# Patient Record
Sex: Male | Born: 1965 | Race: White | Hispanic: No | Marital: Single | Smoking: Current every day smoker
Health system: Southern US, Community
[De-identification: ages and names within clinical notes are randomized; demographics above are authoritative.]

## PROBLEM LIST (undated history)

## (undated) DIAGNOSIS — G4730 Sleep apnea, unspecified: Secondary | ICD-10-CM

## (undated) DIAGNOSIS — E119 Type 2 diabetes mellitus without complications: Secondary | ICD-10-CM

## (undated) HISTORY — PX: EYE SURGERY: SHX253

---

## 1997-12-25 ENCOUNTER — Emergency Department (HOSPITAL_COMMUNITY): Admission: EM | Admit: 1997-12-25 | Discharge: 1997-12-25 | Payer: Self-pay | Admitting: Emergency Medicine

## 2003-05-03 ENCOUNTER — Ambulatory Visit (HOSPITAL_COMMUNITY): Admission: RE | Admit: 2003-05-03 | Discharge: 2003-05-03 | Payer: Self-pay | Admitting: Family Medicine

## 2003-05-03 ENCOUNTER — Encounter: Payer: Self-pay | Admitting: Family Medicine

## 2004-08-13 ENCOUNTER — Inpatient Hospital Stay (HOSPITAL_COMMUNITY): Admission: RE | Admit: 2004-08-13 | Discharge: 2004-08-18 | Payer: Self-pay | Admitting: Psychiatry

## 2004-08-13 ENCOUNTER — Ambulatory Visit: Payer: Self-pay | Admitting: Psychiatry

## 2004-08-13 ENCOUNTER — Emergency Department (HOSPITAL_COMMUNITY): Admission: EM | Admit: 2004-08-13 | Discharge: 2004-08-13 | Payer: Self-pay | Admitting: Emergency Medicine

## 2004-08-31 ENCOUNTER — Other Ambulatory Visit (HOSPITAL_COMMUNITY): Admission: RE | Admit: 2004-08-31 | Discharge: 2004-09-15 | Payer: Self-pay | Admitting: Psychiatry

## 2004-09-29 ENCOUNTER — Inpatient Hospital Stay (HOSPITAL_COMMUNITY): Admission: RE | Admit: 2004-09-29 | Discharge: 2004-10-04 | Payer: Self-pay | Admitting: Psychiatry

## 2004-09-29 ENCOUNTER — Emergency Department (HOSPITAL_COMMUNITY): Admission: EM | Admit: 2004-09-29 | Discharge: 2004-09-29 | Payer: Self-pay | Admitting: Emergency Medicine

## 2004-09-29 ENCOUNTER — Ambulatory Visit: Payer: Self-pay | Admitting: Psychiatry

## 2004-10-25 ENCOUNTER — Ambulatory Visit (HOSPITAL_BASED_OUTPATIENT_CLINIC_OR_DEPARTMENT_OTHER): Admission: RE | Admit: 2004-10-25 | Discharge: 2004-10-25 | Payer: Self-pay

## 2004-10-25 ENCOUNTER — Ambulatory Visit: Payer: Self-pay | Admitting: Addiction Medicine

## 2004-10-25 ENCOUNTER — Other Ambulatory Visit (HOSPITAL_COMMUNITY): Admission: RE | Admit: 2004-10-25 | Discharge: 2004-11-09 | Payer: Self-pay | Admitting: Psychiatry

## 2005-07-11 ENCOUNTER — Ambulatory Visit (HOSPITAL_COMMUNITY): Admission: RE | Admit: 2005-07-11 | Discharge: 2005-07-12 | Payer: Self-pay | Admitting: Orthopedic Surgery

## 2007-04-01 ENCOUNTER — Emergency Department (HOSPITAL_COMMUNITY): Admission: EM | Admit: 2007-04-01 | Discharge: 2007-04-01 | Payer: Self-pay | Admitting: Emergency Medicine

## 2007-05-02 ENCOUNTER — Ambulatory Visit (HOSPITAL_COMMUNITY): Admission: RE | Admit: 2007-05-02 | Discharge: 2007-05-03 | Payer: Self-pay | Admitting: *Deleted

## 2010-05-07 ENCOUNTER — Telehealth (INDEPENDENT_AMBULATORY_CARE_PROVIDER_SITE_OTHER): Payer: Self-pay | Admitting: Internal Medicine

## 2010-05-07 ENCOUNTER — Encounter (INDEPENDENT_AMBULATORY_CARE_PROVIDER_SITE_OTHER): Payer: Self-pay | Admitting: Internal Medicine

## 2010-05-19 ENCOUNTER — Ambulatory Visit: Payer: Self-pay | Admitting: Internal Medicine

## 2010-05-19 ENCOUNTER — Encounter: Payer: Self-pay | Admitting: Physician Assistant

## 2010-05-19 DIAGNOSIS — M2010 Hallux valgus (acquired), unspecified foot: Secondary | ICD-10-CM | POA: Insufficient documentation

## 2010-05-19 DIAGNOSIS — K089 Disorder of teeth and supporting structures, unspecified: Secondary | ICD-10-CM | POA: Insufficient documentation

## 2010-05-19 DIAGNOSIS — R062 Wheezing: Secondary | ICD-10-CM | POA: Insufficient documentation

## 2010-05-19 DIAGNOSIS — K625 Hemorrhage of anus and rectum: Secondary | ICD-10-CM | POA: Insufficient documentation

## 2010-05-19 DIAGNOSIS — B1710 Acute hepatitis C without hepatic coma: Secondary | ICD-10-CM | POA: Insufficient documentation

## 2010-05-19 DIAGNOSIS — F17200 Nicotine dependence, unspecified, uncomplicated: Secondary | ICD-10-CM | POA: Insufficient documentation

## 2010-05-19 DIAGNOSIS — K644 Residual hemorrhoidal skin tags: Secondary | ICD-10-CM | POA: Insufficient documentation

## 2010-05-19 LAB — CONVERTED CEMR LAB: OCCULT 1: NEGATIVE

## 2010-05-24 ENCOUNTER — Encounter: Payer: Self-pay | Admitting: Physician Assistant

## 2010-05-25 ENCOUNTER — Encounter: Payer: Self-pay | Admitting: Physician Assistant

## 2010-05-26 ENCOUNTER — Encounter: Payer: Self-pay | Admitting: Physician Assistant

## 2010-06-01 ENCOUNTER — Telehealth: Payer: Self-pay | Admitting: Physician Assistant

## 2010-06-01 DIAGNOSIS — K921 Melena: Secondary | ICD-10-CM | POA: Insufficient documentation

## 2010-06-04 ENCOUNTER — Encounter (INDEPENDENT_AMBULATORY_CARE_PROVIDER_SITE_OTHER): Payer: Self-pay | Admitting: Internal Medicine

## 2010-06-07 ENCOUNTER — Encounter: Payer: Self-pay | Admitting: Physician Assistant

## 2010-06-09 ENCOUNTER — Ambulatory Visit (HOSPITAL_COMMUNITY): Admission: RE | Admit: 2010-06-09 | Discharge: 2010-06-09 | Payer: Self-pay | Admitting: Gastroenterology

## 2010-06-11 DIAGNOSIS — R7309 Other abnormal glucose: Secondary | ICD-10-CM | POA: Insufficient documentation

## 2010-06-11 LAB — CONVERTED CEMR LAB
ALT: 25 units/L (ref 0–53)
AST: 17 units/L (ref 0–37)
Albumin: 4.2 g/dL (ref 3.5–5.2)
Alkaline Phosphatase: 66 units/L (ref 39–117)
BUN: 14 mg/dL (ref 6–23)
Basophils Absolute: 0.1 10*3/uL (ref 0.0–0.1)
Basophils Relative: 1 % (ref 0–1)
CO2: 27 meq/L (ref 19–32)
Calcium: 9.9 mg/dL (ref 8.4–10.5)
Chloride: 100 meq/L (ref 96–112)
Creatinine, Ser: 0.99 mg/dL (ref 0.40–1.50)
Eosinophils Absolute: 0.3 10*3/uL (ref 0.0–0.7)
Eosinophils Relative: 4 % (ref 0–5)
Glucose, Bld: 191 mg/dL — ABNORMAL HIGH (ref 70–99)
HCT: 51.1 % (ref 39.0–52.0)
Hemoglobin: 16.8 g/dL (ref 13.0–17.0)
Lymphocytes Relative: 28 % (ref 12–46)
Lymphs Abs: 2.7 10*3/uL (ref 0.7–4.0)
MCHC: 32.9 g/dL (ref 30.0–36.0)
MCV: 100.4 fL — ABNORMAL HIGH (ref 78.0–100.0)
Monocytes Absolute: 0.9 10*3/uL (ref 0.1–1.0)
Monocytes Relative: 10 % (ref 3–12)
Neutro Abs: 5.5 10*3/uL (ref 1.7–7.7)
Neutrophils Relative %: 58 % (ref 43–77)
Platelets: 212 10*3/uL (ref 150–400)
Potassium: 4.6 meq/L (ref 3.5–5.3)
RBC: 5.09 M/uL (ref 4.22–5.81)
RDW: 13.5 % (ref 11.5–15.5)
Sodium: 135 meq/L (ref 135–145)
Total Bilirubin: 0.6 mg/dL (ref 0.3–1.2)
Total Protein: 6.6 g/dL (ref 6.0–8.3)
WBC: 9.5 10*3/uL (ref 4.0–10.5)

## 2010-06-14 ENCOUNTER — Ambulatory Visit (HOSPITAL_BASED_OUTPATIENT_CLINIC_OR_DEPARTMENT_OTHER): Admission: RE | Admit: 2010-06-14 | Discharge: 2010-06-14 | Payer: Self-pay | Admitting: Physician Assistant

## 2010-06-14 ENCOUNTER — Encounter (INDEPENDENT_AMBULATORY_CARE_PROVIDER_SITE_OTHER): Payer: Self-pay | Admitting: Nurse Practitioner

## 2010-06-21 ENCOUNTER — Ambulatory Visit: Payer: Self-pay | Admitting: Internal Medicine

## 2010-06-21 LAB — CONVERTED CEMR LAB: Blood Glucose, Fingerstick: 190

## 2010-06-30 ENCOUNTER — Telehealth (INDEPENDENT_AMBULATORY_CARE_PROVIDER_SITE_OTHER): Payer: Self-pay | Admitting: Nurse Practitioner

## 2010-06-30 DIAGNOSIS — G4730 Sleep apnea, unspecified: Secondary | ICD-10-CM | POA: Insufficient documentation

## 2010-07-22 ENCOUNTER — Ambulatory Visit: Payer: Self-pay | Admitting: Internal Medicine

## 2010-08-17 ENCOUNTER — Encounter (INDEPENDENT_AMBULATORY_CARE_PROVIDER_SITE_OTHER): Payer: Self-pay | Admitting: Internal Medicine

## 2010-09-16 NOTE — Letter (Signed)
Summary: DENTAL REFERRAL  DENTAL REFERRAL   Imported By: Arta Bruce 06/11/2010 11:38:49  _____________________________________________________________________  External Attachment:    Type:   Image     Comment:   External Document

## 2010-09-16 NOTE — Progress Notes (Signed)
Summary: [facility] concerns  Phone Note Other Incoming   Summary of Call: From [facility]--list of concerns and results for review, but pt. has never been seen here.   What do we do with these? Initial call taken by: Julieanne Manson MD,  May 07, 2010 2:56 PM  Follow-up for Phone Call        He has a new patient appt 05/19/2010 with you. This information was sent over by [facility] to assist you with the visit.  Follow-up by: Hassell Halim CMA,  May 10, 2010 10:28 AM

## 2010-09-16 NOTE — Letter (Signed)
Summary: REFERRAL//DENTAL   REFERRAL//DENTAL   Imported By: Arta Bruce 06/11/2010 11:37:10  _____________________________________________________________________  External Attachment:    Type:   Image     Comment:   External Document

## 2010-09-16 NOTE — Progress Notes (Signed)
Summary: [facility] Referral  Phone Note Outgoing Call   Summary of Call: sleep study results done on 06/14/2010 shows that pt has sleep apnea notify pt that [facility] will [facility]  advise him on financial form he needs to pick up and complete for [facility] Initial call taken by: Lehman Prom FNP,  June 30, 2010 8:16 AM  Follow-up for Phone Call        I SEND THE REFERRAL TO [facility] BY FAX AND I MAILED THE FORM TO PT. I LVM TO PT TO CALL ME BACK TO NOTIFY HIM ABOUT THE REFERRAL  Follow-up by: Cheryll Dessert,  June 30, 2010 9:29 AM  New Problems: SLEEP APNEA (ICD-780.57)   New Problems: SLEEP APNEA (ICD-780.57) Phone Note Outgoing Call   Summary of Call: sleep study results done on 06/14/2010 shows that pt has sleep apnea notify pt that [facility] will [facility]  advise him on financial form he needs to pick up and complete for [facility] Initial call taken by: Lehman Prom FNP,  June 30, 2010 8:16 AM  Follow-up for Phone Call        I SEND THE REFERRAL TO [facility] BY FAX AND I MAILED THE FORM TO PT. I LVM TO PT TO CALL ME BACK TO NOTIFY HIM ABOUT THE REFERRAL  Follow-up by: Cheryll Dessert,  June 30, 2010 9:29 AM  New Problems: SLEEP APNEA (ICD-780.57)   New Problems: SLEEP APNEA (ICD-780.57)  Appended Document: [facility] Referral    Clinical Lists Changes  Orders: Added new Test order of CPAP/BIPAP (CPAP/BIPAP) - Signed

## 2010-09-16 NOTE — Letter (Signed)
Summary: REFERRAL SLEEP STUDY  REFERRAL SLEEP STUDY   Imported By: Arta Bruce 05/24/2010 16:58:42  _____________________________________________________________________  External Attachment:    Type:   Image     Comment:   External Document

## 2010-09-16 NOTE — Letter (Signed)
Summary: RECEIVED RECORDS FROM [facility] RECORDS FROM [facility]   Imported By: Arta Bruce 06/04/2010 15:23:44  _____________________________________________________________________  External Attachment:    Type:   Image     Comment:   External Document

## 2010-09-16 NOTE — Letter (Signed)
Summary: [facility] HISTORY REPORT  [facility] HISTORY REPORT   Imported By: Arta Bruce 08/30/2010 16:18:56  _____________________________________________________________________  External Attachment:    Type:   Image     Comment:   External Document

## 2010-09-16 NOTE — Letter (Signed)
Summary: REQUESTING RECORDS FROM [facility]  REQUESTING RECORDS FROM [facility]   Imported By: Arta Bruce 05/25/2010 15:38:11  _____________________________________________________________________  External Attachment:    Type:   Image     Comment:   External Document

## 2010-09-16 NOTE — Letter (Signed)
Summary: REFERRAL//SLEEP STUDY  REFERRAL//SLEEP STUDY   Imported By: Arta Bruce 05/24/2010 16:56:30  _____________________________________________________________________  External Attachment:    Type:   Image     Comment:   External Document

## 2010-09-16 NOTE — Assessment & Plan Note (Signed)
Summary: Hep C; Rectal Bleeding; Dental Pain; Bunion   Vital Signs:  Patient profile:   45 year old male Height:      65 inches Weight:      235.2 pounds BMI:     39.28 Temp:     98.1 degrees F oral Pulse rate:   76 / minute Resp:     18 per minute BP sitting:   110 / 72  (left arm) Cuff size:   regular  Vitals Entered By: Armenia Shannon (May 19, 2010 12:11 PM) CC: np...establish care... pt wants a dental referral.... pt says he  has pain in left foot ... Is Patient Diabetic? No Pain Assessment Patient in pain? no       Does patient need assistance? Functional Status Self care Ambulation Normal   CC:  np...establish care... pt wants a dental referral.... pt says he  has pain in left foot ....  History of Present Illness: New patient.  [facility]old by [facility] he has Hep C.  Has been given Hep A and B vaccines.  Discovered when he gave blood recently.  Told HIV is negative.  Apparently had confirmatory testing done at [facility].  Left foot:  Stubbed left great toe 4 years ago.  Has had deformity since.  Has pain with walking.  Never examined after injury.  Notes no significant bruising or swelling after injury.  Tooth:  Notes dental pain for several weeks.  Esp on upper right molar.  No fevers or facial swelling.  No dental care.    Habits & Providers  Alcohol-Tobacco-Diet     Tobacco Status: current  Exercise-Depression-Behavior     Drug Use: no  Current Medications (verified): 1)  None  Allergies (verified): 1)  ! Codeine 2)  ! Penicillin  Past History:  Past Medical History: h/o Mood D/O   a.  admx to [facility] health 2006 (? related to drugs)   b.  no meds now; not seeing psych sleep apnea (dx in 2006 by sleep study)   a.  not using cpap . . . never had f/u fatty liver by CT in 2008 h/o substance abuse   a.  h/o crack cocaine abuse . . . no use in 3 years ? hyperlipidemia  Past Surgical History: s/p umbilical hernia repair 04/2007 s/p L knee scope  for medial and lateral meniscal tears  Family History: Lung CA-grandmother  Social History: Divorced Current Smoker Occupation:occ works with [employer] Drug use-no  a. prior crack cocaine use Alcohol use-yes  a. drinks 8-10 beers every Monday . . . in [employer] Smoking Status:  current Occupation:  employed Drug Use:  no  Review of Systems      See HPI General:  Denies chills and fever. CV:  Denies chest pain or discomfort and shortness of breath with exertion. Resp:  Complains of cough and wheezing. GI:  Denies bloody stools; drops in toilet; not mixed in.  Physical Exam  General:  alert, well-developed, and well-nourished.   Head:  normocephalic and atraumatic.   Eyes:  pupils equal, pupils round, and pupils reactive to light.   Mouth:  poor dentition.  root exposure upper right molar with significant pain with palp Neck:  no cervical lymphadenopathy.   Lungs:  ins/exp wheezes throughout  no rales  Heart:  normal rate and regular rhythm.   Abdomen:  soft, non-tender, and no hepatomegaly.   Rectal:  normal sphincter tone, no masses, and external hemorrhoid(s).   Prostate:  no gland enlargement, no nodules, no asymmetry, and no induration.   Pulses:  left DP and PT 2+ Extremities:  no edema  Neurologic:  alert & oriented X3 and cranial nerves II-XII intact.   Psych:  normally interactive.     Foot/Ankle Exam  Foot Exam:    Left:    Inspection:  Abnormal    hallux valgus left foot   Impression & Recommendations:  Problem # 1:  SLEEP APNEA (ICD-780.57)  Orders: Split Night (Split Night)  Problem # 2:  HEPATITIS C (ICD-070.51)  get records discuss at f/u advised no alcohol  Orders: T-Comprehensive Metabolic Panel (04540-98119)  Problem # 3:  RECTAL BLEEDING (ICD-569.3)  prob from hems get stool cards and cbc prob will need colo  Orders: T-CBC w/Diff (14782-95621) T-Hemoccult Cards-Multiple (82270)  Problem # 4:  WHEEZING  (ICD-786.07) prob has copd proventil as needed consider pfts   Problem # 5:  HALLUX VALGUS (ICD-735.0)  nsaids refer to foot clinic  Orders: Podiatry Referral (Podiatry)  Problem # 6:  DENTAL PAIN (ICD-525.9)  poss smoldering infxn tx with antibiotics refer to dental  Orders: Dental Referral (Dentist)  Problem # 7:  HEMORRHOIDS, EXTERNAL (ICD-455.3) Rx for supps  Problem # 8:  Preventive Health Care (ICD-V70.0) schedule cpe  Complete Medication List: 1)  Naprosyn 500 Mg Tabs (Naproxen) .... Take 1 tablet by mouth two times a day with food as needed for pain 2)  Cleocin 300 Mg Caps (Clindamycin hcl) .... Take one by mouth every 6 hours for 7 days 3)  Anusol-hc 25 Mg Supp (Hydrocortisone acetate) .... Apply one as needed rectal bleeding or irratation 4)  Proventil Hfa 108 (90 Base) Mcg/act Aers (Albuterol sulfate) .Marland Kitchen.. 1-2 puffs every 4-6 hours as needed for wheezing or cough  Patient Instructions: 1)  Sign form to get records.  Need records from [facility] regarding Hepatitis C.  Need ALL lab results pertaining to Hepatitis C. 2)  Schedule CPE with Dr. Delrae Alfred in 3-4 weeks. Prescriptions: PROVENTIL HFA 108 (90 BASE) MCG/ACT AERS (ALBUTEROL SULFATE) 1-2 puffs every 4-6 hours as needed for wheezing or cough  #1 x 5   Entered and Authorized by:   Tereso Newcomer PA-C   Signed by:   Tereso Newcomer PA-C on 05/19/2010   Method used:   Print then Give to Patient   RxID:   [id number] ANUSOL-HC 25 MG SUPP (HYDROCORTISONE ACETATE) apply one as needed rectal bleeding or irratation  #20 x 1   Entered and Authorized by:   Tereso Newcomer PA-C   Signed by:   Tereso Newcomer PA-C on 05/19/2010   Method used:   Print then Give to Patient   RxID:   [id number] CLEOCIN 300 MG CAPS (CLINDAMYCIN HCL) Take one by mouth every 6 hours for 7 days  #28 x 0   Entered and Authorized by:   Tereso Newcomer PA-C   Signed by:   Tereso Newcomer PA-C on 05/19/2010   Method used:   Print then Give to  Patient   RxID:   [id number] NAPROSYN 500 MG TABS (NAPROXEN) Take 1 tablet by mouth two times a day with food as needed for pain  #30 x 1   Entered and Authorized by:   Tereso Newcomer PA-C   Signed by:   Tereso Newcomer PA-C on 05/19/2010   Method used:   Print then Give to Patient   RxID:   [id number]   Laboratory Results    Stool - Occult Blood Hemmoccult #1:  negative Date: 05/19/2010   Appended Document: Hep C; Rectal Bleeding; Dental Pain; Bunion Stools pos for blood he needs to go to GI please refer for heme pos stools notify patient, then send to Nehemiah Massed PA-C  June 01, 2010 12:26 PM  Left message on answering machine for pt to call back.Marland KitchenMarland KitchenArmenia Shannon  June 01, 2010 4:21 PM   pt is aware.... Armenia Shannon  June 01, 2010 4:22 PM  Laboratory Results    Stool - Occult Blood Hemmoccult #1: positive Date: 05/31/2010 Hemoccult #2: positive Date: 05/31/2010 Hemoccult #3: negative Date: 05/31/2010      Impression & Recommendations:  Problem # 1:  HEMOCCULT POSITIVE STOOL (ICD-578.1)  refer to GI  Orders: Gastroenterology Referral (GI)  Complete Medication List: 1)  Naprosyn 500 Mg Tabs (Naproxen) .... Take 1 tablet by mouth two times a day with food as needed for pain 2)  Anusol-hc 25 Mg Supp (Hydrocortisone acetate) .... Apply one as needed rectal bleeding or irratation 3)  Proventil Hfa 108 (90 Base) Mcg/act Aers (Albuterol sulfate) .Marland Kitchen.. 1-2 puffs every 4-6 hours as needed for wheezing or cough

## 2010-09-16 NOTE — Progress Notes (Signed)
Summary: GI REFERRAL   Phone Note Call from Patient   Summary of Call: nora... pt has a referral in system for GI Initial call taken by: Armenia Shannon,  June 01, 2010 4:22 PM  Follow-up for Phone Call        PT HAVE AN APPT [facility]  06-04-10 @ 10:45AM DR Randa Evens  PT AWARE OF HIS APPT  Follow-up by: Cheryll Dessert,  June 02, 2010 10:17 AM

## 2010-10-26 NOTE — Letter (Signed)
Summary: [facility] //PROGRESS NOTE  [facility] //PROGRESS NOTE   Imported ByArta Bruce 10/18/2010 16:36:29  _____________________________________________________________________  External Attachment:    Type:   Image     Comment:   External Document

## 2010-10-26 NOTE — Letter (Signed)
Summary: [facility]   Imported By: Arta Bruce 10/18/2010 16:29:06  _____________________________________________________________________  External Attachment:    Type:   Image     Comment:   External Document

## 2010-12-28 NOTE — Op Note (Signed)
Chris [name], Chris [name]              ACCOUNT NO.:  [id number]   MEDICAL RECORD NO.:  [id number]          PATIENT TYPE:  OIB   LOCATION:  1540                         FACILITY[facility]  [facility]   PHYSICIAN:  Alfonse Ras, MD   DATE OF BIRTH:  09-06-1965   DATE OF PROCEDURE:  05/02/2007  DATE OF DISCHARGE:                               OPERATIVE REPORT   PREOPERATIVE DIAGNOSIS:  Umbilical hernia.   POSTOPERATIVE DIAGNOSIS:  Umbilical hernia, incarcerated.   PROCEDURE:  Repair of incarcerated umbilical hernia with mesh.   SURGEON:  Alfonse Ras, M.D.   ANESTHESIA:  General.   DESCRIPTION:  The patient was taken to the operating room and placed in  supine position.  After adequate general anesthesia was induced using a  laryngeal mask, the abdomen was prepped and draped in normal sterile  fashion.  Using a transverse infraumbilical incision, I dissected down  onto the hernia sac.  This was mobilized off the fascia.  The fascial  defect was only about 2 cm in width; however, there was significant  incarcerated omentum, which I truncated at the base and ligated with an  0 silk ligature.  The fascial defect was then closed with interrupted #1  figure-of-eight Novofil.  A piece of 1 x 4 mesh was then placed over the  raised flaps and tacked using #1 Novofil as well.  This was secured  around the periphery.  I was satisfied with the repair.  It was  copiously irrigated.  The skin was closed with a subcuticular 3-0  Monocryl.  Dermabond dressing was placed.  Sterile cotton ball was  placed in the umbilicus.  The patient tolerated the procedure well and  went to PACU in good condition.      Alfonse Ras, MD  Electronically Signed     KRE/MEDQ  D:  05/02/2007  T:  05/02/2007  Job:  [id number]

## 2010-12-31 NOTE — Procedures (Signed)
NAME:  Chris [name], Chris [name]              ACCOUNT NO.:  [id number]   MEDICAL RECORD NO.:  [id number]          PATIENT TYPE:  OUT   LOCATION:  SLEEP CENTER                 FACILITY[facility]   PHYSICIAN:  Clinton D. Maple Hudson, M.D. DATE OF BIRTH:  05-06-1966   DATE OF STUDY:  10/25/2004                              NOCTURNAL POLYSOMNOGRAM   REFERRING PHYSICIAN:  Dr. Jeanann Lewandowsky.   INDICATIONS FOR SURGERY:  Hypersomnia with sleep apnea. Epworth sleepiness  score 7/24, BMI 34, weight 240 pounds.   SLEEP ARCHITECTURE:  Total sleep time 312 minutes with sleep efficiency 68%.  Stage 1 was 8%, stage 2 79%, stages 3 and 4 were absent, REM was 13% of  total sleep time. Sleep latency 23 minutes, REM latency 281 minutes, awake  after sleep onset 126 minutes, arousal index 29. No medications were taken.   RESPIRATORY DATA:  Split study protocol. Respiratory disturbance index (RDI)  69 obstructive events per hour before CPAP. This included 121 obstructive  apneas, 12 central apneas, and 16 hypopneas before CPAP. The events were not  positional. REM RDI 67. CPAP titration was attempted by protocol. The  technician tried a number of changes of masks and technology including  switch from CPAP to BiPAP but was unable to obtain patient toleration or  control. The patient refused to wear the masks, stating he could not exhale  and could not tolerate the positive pressure, even with adjustment. He  complained of nasal congestion not relieved by a Breathe Right strip.  Effective therapy with positive airway pressure could not be achieved on  this night.   OXYGEN DATA:  Moderate snoring and mouth-breathing with oxygen desaturation  to a nadir of 80%. Mean oxygen saturation through the study was 92-94% on  room air.   CARDIAC DATA:  Normal sinus rhythm.   MOVEMENT/PARASOMNIA:  Insignificant leg jerks, bathroom x1.   IMPRESSION/RECOMMENDATION:  1.  Severe obstructive sleep apnea/hypopnea syndrome,  respiratory      disturbance index 69 per hour with oxygen desaturation to 80% and      moderate snoring.  2.  The patient was unable to tolerate positive pressure for CPAP/BiPAP      titration by protocol on this study night. Consider return with a nasal      decongestant and sleep medication for a CPAP titration study. This will      provide      more time for the technician to work with the patient and hopefully      desensitize him to this therapy for a valid opportunity to try it.      Otherwise, consider for alternative therapies as appropriate.      CDY/MEDQ  D:  10/31/2004 10:48:19  T:  11/01/2004 09:40:26  Job:  [id number]

## 2010-12-31 NOTE — H&P (Signed)
NAMENORWIN, ALEMAN NO.:  [id number]   MEDICAL RECORD NO.:  [id number]          PATIENT TYPE:  IPS   LOCATION:  0302                          [facility]ILITY[facility]  [facility]   PHYSICIAN:  Jeanice Lim, M.D. DATE OF BIRTH:  1965/09/22   DATE OF ADMISSION:  08/13/2004  DATE OF DISCHARGE:                         PSYCHIATRIC ADMISSION ASSESSMENT   This is a voluntary admission.   IDENTIFYING STATEMENT:  This is a 45 year old divorced white male.  Apparently, he presented to the emergency room last evening and reported  that he was having depressed thinking, that he was depressed, and he was  thinking about killing himself. He had one prior suicide attempt back in  1992 due to depression, and he feels like he is loosing it again. His job  may be in jeopardy. Also, his roommate wants to put him out due to his drug  use. In talking to the patient, apparently he has been using cocaine on and  off for 15 to 20 years now. Back in 1992, actually, cocaine had created an  issue between him and his then girlfriend, and that is when he attempted  suicide.   PAST PSYCHIATRIC HISTORY:  He was hospitalized in 1992 at [facility] after trying to commit suicide by shooting himself under the chin.  He denies any other outpatient treatment or inpatient treatment.   SOCIAL HISTORY:  He has finished high school. He has been employed as a  Printmaker. He has been married once, and he has a 57 year old daughter from  that marriage.   FAMILY HISTORY:  He denies any history for depression, anxiety, mental  illness.   ALCOHOL AND DRUG HISTORY:  His UDS was positive for marijuana as well as  cocaine. He states that marijuana is once in a while. Cocaine is daily. His  smokes one pack of cigarettes per day.   MEDICAL HISTORY AND PRIMARY CARE Latysha Thackston:  He has none. Medical problems:  None are known, although his glucose was elevated on admission. Will check  that out with a hemoglobin A1c.  He is not prescribed any medications. He has  drug allergies to PENICILLIN and CODEINE.   PHYSICAL EXAMINATION:  He is somewhat overweight, otherwise with no  remarkable findings. It is as per his exam in the emergency room.   MENTAL STATUS EXAM:  He is alert and oriented x3. He is somewhat disheveled;  however, his gait and motor are normal. He has poor eye contact. His speech  was not pressure. His mood was appropriately concerned and contrite about  his situation. His affect was congruent. Thought processes are clear,  rationale, and goal oriented. He would like to get drug treatment. He does  not want to happen again, this being hospitalization. Judgment and insight  are intact. Concentration and memory are intact. Intelligence is average. He  denies suicidal or homicidal ideations today. He denies any auditory or  visual hallucinations. He states he gets anxious and then he uses.   AXIS I:  1.  Substance abuse, polysubstance.  2.  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  AXIS II:  Deferred.   AXIS III:  None known, rule out glucose issues.   AXIS IV:  Severe, job and residence are in jeopardy.   AXIS V:  30.   PLAN:  The plan is to admit for safety and stabilization to start  antipsychotropic medication as indicated and to help him get into drug  rehabilitation post discharge.     Mick   MD/MEDQ  D:  08/14/2004  T:  08/14/2004  Job:  [id number]

## 2010-12-31 NOTE — Discharge Summary (Signed)
NAMELAEL, PILCH NO.:  [id number]   MEDICAL RECORD NO.:  [id number]          PATIENT TYPE:  IPS   LOCATION:  0302                          [facility]ACILITY:  [facility]   PHYSICIAN:  Jeanice Lim, M.D. DATE OF BIRTH:  [birth year]   DATE OF ADMISSION:  08/13/2004  DATE OF DISCHARGE:  08/18/2004                                 DISCHARGE SUMMARY   IDENTIFYING DATA:  This is a 45 year old divorced Caucasian male presenting  to the emergency room, reporting depressive thinking with thoughts of  killing himself.  Had one prior suicide attempt back in 1992 due to  depression.  Felt like he was losing it again on his behalf and was fearful  of his ability to stay safe outside of the hospital.  Reported roommate had  put him out due to his drug use.  He had been using cocaine on and off for  15-20 years now, back in 1992, actually, cocaine had created an issue  between him and his girlfriend and that was when he had attempted suicide.   PAST PSYCHIATRIC HISTORY:  In 1992, was at [facility] trying to  commit suicide by shooting himself under his chin.  Denied outpatient  treatment.   ALCOHOL/DRUG HISTORY:  Urine drug screen positive for marijuana as well as  cocaine.  Cocaine use is daily.  Also 1-1/2 packs of cigarettes per day.   PRIMARY CARE PHYSICIAN:  None.   ALLERGIES:  PENICILLIN and CODEINE.   PHYSICAL EXAMINATION:  Physical and neurologic exam essentially within  normal limits except for slight obesity.   LABORATORY DATA:  Routine admission labs within normal limits.   MENTAL STATUS EXAM:  Alert, oriented, somewhat disheveled.  Gait within  normal limits.  Poor eye contact.  Speech not pressured.  Mood appropriately  concerned about situation.  Dysphoric, depressed.  Affect restricted.  Thought processes goal directed.  Was motivated to get drug treatment,  possible residential treatment.  Appeared to understand the severity of his  addiction.   Cognition intact.  Judgment and insight were fair.  He denied  acute suicidal or homicidal ideation.  Admits to anxiety craving and feeling  out of control when unable to use.   ADMISSION DIAGNOSES:   AXIS I:  1.  Polysubstance abuse.  2.  Cocaine dependence.  3.  Cannabis abuse.  4.  Rule out substance-induced mood disorder versus major depressive      disorder, recurrent, moderate.   AXIS II:  Deferred.   AXIS III:  None; rule out pre-diabetes.   AXIS IV:  Severe (stressors of homeless, lack of occupation, unemployed,  limited support system, financial stress, limited access to health care).   AXIS V:  30/55.   HOSPITAL COURSE:  The patient was admitted and ordered routine p.r.n.  medications and underwent further monitoring.  Was encouraged to participate  in individual, group and milieu therapy.  The patient was given education  regarding substance abuse.  Worked on a relapse prevention plan.  Was  stabilized on psychotropics.  Monitored for withdrawal symptoms, which he  tolerated without complications.  He was recommended for follow-up with  individual therapy due to questions about NA with Ger[facility] Apley,  certified therapist, as well as optimized on medications.  Symmetrel given  for cocaine cravings.  Celexa started for targeting depressive symptoms.  The patient was screened for STDs and reported a positive response to crisis  interventions.   CONDITION ON DISCHARGE:  Discharged in improved condition with no dangerous  ideation.  Mood euthymic.  No acute withdrawal symptoms.  Motivation to  remain abstinent and be compliant with follow-up plan.  The patient was  given medication education.  Risk/benefit ratio and alternative treatments  regarding medications were discussed.   DISCHARGE MEDICATIONS:  1.  Symmetrel 100 mg b.i.d.  2.  Trazodone 100 mg, 1 q.h.s.  3.  Neurontin 100 mg, 2 every 9 a.m., 2 p.m., 6 p.m. and 10 p.m.  4.  Celexa 20 mg, 1 q.a.m.  5.   Seroquel 25 mg, 1 up to 3 at 9 p.m. p.r.n. insomnia and 1-2 q.6h. p.r.n.      agitation.   FOLLOW UP:  The patient was discharged to follow up with Dr. Jacqulyn Bath on  Thursday, August 26, 2004 at 10:15 a.m. at [facility] and [facility] Apley on Thursday, August 27, 2003 at 9:45 a.m.   DISCHARGE DIAGNOSES:   AXIS I:  1.  Polysubstance abuse.  2.  Cocaine dependence.  3.  Cannabis abuse.  4.  Rule out substance-induced mood disorder versus major depressive      disorder, recurrent, moderate.   AXIS II:  Deferred.   AXIS III:  None; rule out pre-diabetes.   AXIS IV:  Severe (stressors of homeless, lack of occupation, unemployed,  limited support system, financial stress, limited access to health care).   AXIS V:  Global Assessment of Functioning on discharge 55-60.      JEM/MEDQ  D:  09/16/2004  T:  09/16/2004  Job:  [id number]

## 2010-12-31 NOTE — Op Note (Signed)
[name], Chris              ACCOUNT NO.:  [id number]   MEDICAL RECORD NO.:  [id number]          PATIENT TYPE:  OIB   LOCATION:  2550                         FACILITY:  [facility]   PHYSICIAN:  Robert A. Thurston Hole, M.D. 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   POSTOPERATIVE DIAGNOSIS:  Left knee medial and lateral meniscal tears with  chondromalacia, synovitis, and lateral patellar tracking.   PROCEDURE:  1.  Left knee examination under anesthesia; followed by arthroscopic      partial, medial and lateral meniscectomy.  2.  Left knee chondroplasty with partial synovectomy.  3.  Left knee lateral retinacular release.   SURGEON:  Elana Alm. Thurston Hole, M.D.   ASSISTANT:  Julien Girt, P.A.   ANESTHESIA:  General.   OPERATIVE TIME:  Of 40 minutes.   COMPLICATIONS:  None.   INDICATIONS FOR PROCEDURE:  Chris [name] is a 45 year old gentleman who  injured his left knee in July 2006. Had significant pain with exam and x-  rays documenting meniscal tearing with chondromalacia and possible lateral  meniscal tracking who has failed conservative care and is now to undergo  arthroscopy.   DESCRIPTION:  Chris [name] is brought to the operating room on 07/11/2005,  placed on operative table in the supine position. After an adequate level of  general anesthesia was obtained, his left knee was examined. He had range of  motion from 0-125 degrees, 1+ to 2+ crepitation, knee stable, ligamentous  exam with mild lateral patellar tracking. The knee was sterilely injected  with 0.25% Marcaine with epinephrine. Left leg was then prepped using  sterile DuraPrep; and draped using sterile technique. Originally through an  anterolateral portal the arthroscope, with a pump attached,  was placed in  through an anteromedial portal; and arthroscopic probe was placed.   On initial inspection the medial compartment showed 25% grade 3  chondromalacia which was debrided; medial meniscus tear 20% posterior medial  wound which was resected back to stable rim. ACL and PCL were normal.  Lateral compartment showed 20%,  grade 3 chondromalacia which was debrided.  Lateral meniscus showed partial tearing 25% posterolateral corner which was  resected back to a stable rim. Patellofemoral joint showed 50% grade 3  chondromalacia of the patella, 25% grade 3 changes of the femoral groove.  This was debrided. Moderate and lateral patellar tracking was noted. He had  a previous lateral release, but he had re-scarred this area; and I performed  another release laterally with an ArthroCare wand.  No excessive bleeding  was encountered. This significantly decompressed the patellofemoral joint  and improved patellar tracking.   After this was done, no further pathology was noted. The instruments were  removed. Portals closed with 3-0 nylon suture and injected with 0.25%  Marcaine with epinephrine and 4 mg of morphine. Sterile dressings applied;  and the patient awakened and taken to recovery in stable condition.  FOLLOWUP CARE:  Chris [name] will be followed overnight for close observation  due to his sleep apnea. He will be discharged tomorrow, if stable. He will  see me back in the office in a week for sutures out and followup.      Robert A. Thurston Hole, M.D.  

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
## 2011-05-26 LAB — HEMOGLOBIN AND HEMATOCRIT, BLOOD
HCT: 43.8
Hemoglobin: 15.3

## 2011-05-27 LAB — POCT I-STAT CREATININE
Creatinine, Ser: 0.9
Operator id: 294521

## 2011-05-27 LAB — I-STAT 8, (EC8 V) (CONVERTED LAB)
Acid-Base Excess: 2
BUN: 11
Bicarbonate: 25 — ABNORMAL HIGH
Chloride: 103
Glucose, Bld: 199 — ABNORMAL HIGH
HCT: 53 — ABNORMAL HIGH
Hemoglobin: 18 — ABNORMAL HIGH
Operator id: 294521
Potassium: 4.4
Sodium: 133 — ABNORMAL LOW
TCO2: 26
pCO2, Ven: 33.7 — ABNORMAL LOW
pH, Ven: 7.479 — ABNORMAL HIGH

## 2011-05-27 LAB — DIFFERENTIAL
Basophils Absolute: 0
Basophils Relative: 0
Eosinophils Absolute: 0.1
Eosinophils Relative: 0
Lymphocytes Relative: 7 — ABNORMAL LOW
Lymphs Abs: 1.4
Monocytes Absolute: 1.4 — ABNORMAL HIGH
Monocytes Relative: 7
Neutro Abs: 17.9 — ABNORMAL HIGH
Neutrophils Relative %: 86 — ABNORMAL HIGH

## 2011-05-27 LAB — BASIC METABOLIC PANEL
BUN: 10
CO2: 24
Calcium: 9.1
Chloride: 100
Creatinine, Ser: 0.97
GFR calc Af Amer: >60
GFR calc non Af Amer: >60
Glucose, Bld: 197 — ABNORMAL HIGH
Potassium: 4.3
Sodium: 133 — ABNORMAL LOW

## 2011-05-27 LAB — URINE MICROSCOPIC-ADD ON

## 2011-05-27 LAB — URINALYSIS, ROUTINE W REFLEX MICROSCOPIC
Glucose, UA: NEGATIVE
Hgb urine dipstick: NEGATIVE
Ketones, ur: 15 — AB
Nitrite: NEGATIVE
Protein, ur: NEGATIVE
Specific Gravity, Urine: 1.029
Urobilinogen, UA: 1
pH: 6.5

## 2011-05-27 LAB — CBC
HCT: 47.9
Hemoglobin: 16.7
MCHC: 34.9
MCV: 94.6
Platelets: 277
RBC: 5.07
RDW: 13
WBC: 20.8 — ABNORMAL HIGH

## 2011-05-27 LAB — OCCULT BLOOD X 1 CARD TO LAB, STOOL: Fecal Occult Bld: NEGATIVE

## 2015-07-17 ENCOUNTER — Emergency Department (HOSPITAL_COMMUNITY): Payer: BLUE CROSS/BLUE SHIELD

## 2015-07-17 ENCOUNTER — Encounter (HOSPITAL_COMMUNITY): Payer: Self-pay | Admitting: Emergency Medicine

## 2015-07-17 ENCOUNTER — Emergency Department (HOSPITAL_COMMUNITY)
Admission: EM | Admit: 2015-07-17 | Discharge: 2015-07-17 | Disposition: A | Discharge status: Home / Self Care | Payer: BLUE CROSS/BLUE SHIELD | Attending: Emergency Medicine | Admitting: Emergency Medicine

## 2015-07-17 DIAGNOSIS — F17200 Nicotine dependence, unspecified, uncomplicated: Secondary | ICD-10-CM | POA: Insufficient documentation

## 2015-07-17 DIAGNOSIS — Z8669 Personal history of other diseases of the nervous system and sense organs: Secondary | ICD-10-CM | POA: Insufficient documentation

## 2015-07-17 DIAGNOSIS — Y998 Other external cause status: Secondary | ICD-10-CM | POA: Diagnosis not present

## 2015-07-17 DIAGNOSIS — S161XXA Strain of muscle, fascia and tendon at neck level, initial encounter: Secondary | ICD-10-CM | POA: Diagnosis not present

## 2015-07-17 DIAGNOSIS — S3992XA Unspecified injury of lower back, initial encounter: Secondary | ICD-10-CM | POA: Diagnosis not present

## 2015-07-17 DIAGNOSIS — Y92410 Unspecified street and highway as the place of occurrence of the external cause: Secondary | ICD-10-CM | POA: Diagnosis not present

## 2015-07-17 DIAGNOSIS — Z880 Allergy status to penicillin: Secondary | ICD-10-CM | POA: Insufficient documentation

## 2015-07-17 DIAGNOSIS — Z79899 Other long term (current) drug therapy: Secondary | ICD-10-CM | POA: Diagnosis not present

## 2015-07-17 DIAGNOSIS — S46912A Strain of unspecified muscle, fascia and tendon at shoulder and upper arm level, left arm, initial encounter: Secondary | ICD-10-CM | POA: Diagnosis not present

## 2015-07-17 DIAGNOSIS — Y9389 Activity, other specified: Secondary | ICD-10-CM | POA: Diagnosis not present

## 2015-07-17 DIAGNOSIS — E119 Type 2 diabetes mellitus without complications: Secondary | ICD-10-CM | POA: Insufficient documentation

## 2015-07-17 DIAGNOSIS — S199XXA Unspecified injury of neck, initial encounter: Secondary | ICD-10-CM | POA: Diagnosis present

## 2015-07-17 HISTORY — DX: Type 2 diabetes mellitus without complications: E11.9

## 2015-07-17 HISTORY — DX: Sleep apnea, unspecified: G47.30

## 2015-07-17 MED ORDER — HYDROCODONE-ACETAMINOPHEN 5-325 MG PO TABS: 2.0000 | ORAL_TABLET | ORAL | Status: DC | PRN

## 2015-07-17 MED ORDER — HYDROCODONE-ACETAMINOPHEN 5-325 MG PO TABS
2.0000 | ORAL_TABLET | Freq: Once | ORAL | Status: AC
Start: 2015-07-17 — End: 2015-07-17
  Administered 2015-07-17: 2 via ORAL
  Filled 2015-07-17: qty 2

## 2015-07-17 MED ORDER — ONDANSETRON 4 MG PO TBDP
4.0000 mg | ORAL_TABLET | Freq: Once | ORAL | Status: AC
  Administered 2015-07-17: 4 mg via ORAL
  Filled 2015-07-17: qty 1

## 2015-07-17 MED ORDER — CYCLOBENZAPRINE HCL 10 MG PO TABS
10.0000 mg | ORAL_TABLET | Freq: Once | ORAL | Status: AC
  Administered 2015-07-17: 10 mg via ORAL
  Filled 2015-07-17: qty 1

## 2015-07-17 MED ORDER — NAPROXEN 500 MG PO TABS: 500.0000 mg | ORAL_TABLET | Freq: Two times a day (BID) | ORAL | Status: DC

## 2015-07-17 MED ORDER — CYCLOBENZAPRINE HCL 10 MG PO TABS: 10.0000 mg | ORAL_TABLET | Freq: Two times a day (BID) | ORAL | Status: DC | PRN

## 2015-07-17 NOTE — Discharge Instructions (Signed)
Cervical Sprain °A cervical sprain is an injury in the neck in which the strong, fibrous tissues (ligaments) that connect your neck bones stretch or tear. Cervical sprains can range from mild to severe. Severe cervical sprains can cause the neck vertebrae to be unstable. This can lead to damage of the spinal cord and can result in serious nervous system problems. The amount of time it takes for a cervical sprain to get better depends on the cause and extent of the injury. Most cervical sprains heal in 1 to 3 weeks. °CAUSES  °Severe cervical sprains may be caused by:  °· Contact sport injuries (such as from football, rugby, wrestling, hockey, auto racing, gymnastics, diving, martial arts, or boxing).   °· Motor vehicle collisions.   °· Whiplash injuries. This is an injury from a sudden forward and backward whipping movement of the head and neck.  °· Falls.   °Mild cervical sprains may be caused by:  °· Being in an awkward position, such as while cradling a telephone between your ear and shoulder.   °· Sitting in a chair that does not offer proper support.   °· Working at a poorly designed computer station.   °· Looking up or down for long periods of time.   °SYMPTOMS  °· Pain, soreness, stiffness, or a burning sensation in the front, back, or sides of the neck. This discomfort may develop immediately after the injury or slowly, 24 hours or more after the injury.   °· Pain or tenderness directly in the middle of the back of the neck.   °· Shoulder or upper back pain.   °· Limited ability to move the neck.   °· Headache.   °· Dizziness.   °· Weakness, numbness, or tingling in the hands or arms.   °· Muscle spasms.   °· Difficulty swallowing or chewing.   °· Tenderness and swelling of the neck.   °DIAGNOSIS  °Most of the time your health care provider can diagnose a cervical sprain by taking your history and doing a physical exam. Your health care provider will ask about previous neck injuries and any known neck  problems, such as arthritis in the neck. X-rays may be taken to find out if there are any other problems, such as with the bones of the neck. Other tests, such as a CT scan or MRI, may also be needed.  °TREATMENT  °Treatment depends on the severity of the cervical sprain. Mild sprains can be treated with rest, keeping the neck in place (immobilization), and pain medicines. Severe cervical sprains are immediately immobilized. Further treatment is done to help with pain, muscle spasms, and other symptoms and may include: °· Medicines, such as pain relievers, numbing medicines, or muscle relaxants.   °· Physical therapy. This may involve stretching exercises, strengthening exercises, and posture training. Exercises and improved posture can help stabilize the neck, strengthen muscles, and help stop symptoms from returning.   °HOME CARE INSTRUCTIONS  °· Put ice on the injured area.   °¨ Put ice in a plastic bag.   °¨ Place a towel between your skin and the bag.   °¨ Leave the ice on for 15-20 minutes, 3-4 times a day.   °· If your injury was severe, you may have been given a cervical collar to wear. A cervical collar is a two-piece collar designed to keep your neck from moving while it heals. °¨ Do not remove the collar unless instructed by your health care provider. °¨ If you have long hair, keep it outside of the collar. °¨ Ask your health care provider before making any adjustments to your collar. Minor   adjustments may be required over time to improve comfort and reduce pressure on your chin or on the back of your head. °¨ If you are allowed to remove the collar for cleaning or bathing, follow your health care provider's instructions on how to do so safely. °¨ Keep your collar clean by wiping it with mild soap and water and drying it completely. If the collar you have been given includes removable pads, remove them every 1-2 days and hand wash them with soap and water. Allow them to air dry. They should be completely  dry before you wear them in the collar. °¨ If you are allowed to remove the collar for cleaning and bathing, wash and dry the skin of your neck. Check your skin for irritation or sores. If you see any, tell your health care provider. °¨ Do not drive while wearing the collar.   °· Only take over-the-counter or prescription medicines for pain, discomfort, or fever as directed by your health care provider.   °· Keep all follow-up appointments as directed by your health care provider.   °· Keep all physical therapy appointments as directed by your health care provider.   °· Make any needed adjustments to your workstation to promote good posture.   °· Avoid positions and activities that make your symptoms worse.   °· Warm up and stretch before being active to help prevent problems.   °SEEK MEDICAL CARE IF:  °· Your pain is not controlled with medicine.   °· You are unable to decrease your pain medicine over time as planned.   °· Your activity level is not improving as expected.   °SEEK IMMEDIATE MEDICAL CARE IF:  °· You develop any bleeding. °· You develop stomach upset. °· You have signs of an allergic reaction to your medicine.   °· Your symptoms get worse.   °· You develop new, unexplained symptoms.   °· You have numbness, tingling, weakness, or paralysis in any part of your body.   °MAKE SURE YOU:  °· Understand these instructions. °· Will watch your condition. °· Will get help right away if you are not doing well or get worse. °  °This information is not intended to replace advice given to you by your health care provider. Make sure you discuss any questions you have with your health care provider. °  °Document Released: 05/29/2007 Document Revised: 08/06/2013 Document Reviewed: 02/06/2013 °Elsevier Interactive Patient Education ©2016 [employer] ° °Motor Vehicle Collision °It is common to have multiple bruises and sore muscles after a motor vehicle collision (MVC). These tend to feel worse for the first 24 hours.  You may have the most stiffness and soreness over the first several hours. You may also feel worse when you wake up the first morning after your collision. After this point, you will usually begin to improve with each day. The speed of improvement often depends on the severity of the collision, the number of injuries, and the location and nature of these injuries. °HOME CARE INSTRUCTIONS °· Put ice on the injured area. °· Put ice in a plastic bag. °· Place a towel between your skin and the bag. °· Leave the ice on for 15-20 minutes, 3-4 times a day, or as directed by your health care provider. °· Drink enough fluids to keep your urine clear or pale yellow. Do not drink alcohol. °· Take a warm shower or bath once or twice a day. This will increase blood flow to sore muscles. °· You may return to activities as directed by your caregiver. Be careful when lifting, as this   may aggravate neck or back pain.  Only take over-the-counter or prescription medicines for pain, discomfort, or fever as directed by your caregiver. Do not use aspirin. This may increase bruising and bleeding. SEEK IMMEDIATE MEDICAL CARE IF:  You have numbness, tingling, or weakness in the arms or legs.  You develop severe headaches not relieved with medicine.  You have severe neck pain, especially tenderness in the middle of the back of your neck.  You have changes in bowel or bladder control.  There is increasing pain in any area of the body.  You have shortness of breath, light-headedness, dizziness, or fainting.  You have chest pain.  You feel sick to your stomach (nauseous), throw up (vomit), or sweat.  You have increasing abdominal discomfort.  There is blood in your urine, stool, or vomit.  You have pain in your shoulder (shoulder strap areas).  You feel your symptoms are getting worse. MAKE SURE YOU:  Understand these instructions.  Will watch your condition.  Will get help right away if you are not doing well  or get worse.   This information is not intended to replace advice given to you by your health care provider. Make sure you discuss any questions you have with your health care provider.   Document Released: 08/01/2005 Document Revised: 08/22/2014 Document Reviewed: 12/29/2010 Elsevier Interactive Patient Education 2016 [employer]  Rotator Cuff Injury Rotator cuff injury is any type of injury to the set of muscles and tendons that make up the stabilizing unit of your shoulder. This unit holds the ball of your upper arm bone (humerus) in the socket of your shoulder blade (scapula).  CAUSES Injuries to your rotator cuff most commonly come from sports or activities that cause your arm to be moved repeatedly over your head. Examples of this include throwing, weight lifting, swimming, or racquet sports. Long lasting (chronic) irritation of your rotator cuff can cause soreness and swelling (inflammation), bursitis, and eventual damage to your tendons, such as a tear (rupture). SIGNS AND SYMPTOMS Acute rotator cuff tear:  Sudden tearing sensation followed by severe pain shooting from your upper shoulder down your arm toward your elbow.  Decreased range of motion of your shoulder because of pain and muscle spasm.  Severe pain.  Inability to raise your arm out to the side because of pain and loss of muscle power (large tears). Chronic rotator cuff tear:  Pain that usually is worse at night and may interfere with sleep.  Gradual weakness and decreased shoulder motion as the pain worsens.  Decreased range of motion. Rotator cuff tendinitis:  Deep ache in your shoulder and the outside upper arm over your shoulder.  Pain that comes on gradually and becomes worse when lifting your arm to the side or turning it inward. DIAGNOSIS Rotator cuff injury is diagnosed through a medical history, physical exam, and imaging exam. The medical history helps determine the type of rotator cuff injury. Your  health care provider will look at your injured shoulder, feel the injured area, and ask you to move your shoulder in different positions. X-ray exams typically are done to rule out other causes of shoulder pain, such as fractures. MRI is the exam of choice for the most severe shoulder injuries because the images show muscles and tendons.  TREATMENT  Chronic tear:  Medicine for pain, such as acetaminophen or ibuprofen.  Physical therapy and range-of-motion exercises may be helpful in maintaining shoulder function and strength.  Steroid injections into your shoulder joint.  Surgical repair of the rotator cuff if the injury does not heal with noninvasive treatment. Acute tear:  Anti-inflammatory medicines such as ibuprofen and naproxen to help reduce pain and swelling.  A sling to help support your arm and rest your rotator cuff muscles. Long-term use of a sling is not advised. It may cause significant stiffening of the shoulder joint.  Surgery may be considered within a few weeks, especially in younger, active people, to return the shoulder to full function.  Indications for surgical treatment include the following:  Age younger than 60 years.  Rotator cuff tears that are complete.  Physical therapy, rest, and anti-inflammatory medicines have been used for 6-8 weeks, with no improvement.  Employment or sporting activity that requires constant shoulder use. Tendinitis:  Anti-inflammatory medicines such as ibuprofen and naproxen to help reduce pain and swelling.  A sling to help support your arm and rest your rotator cuff muscles. Long-term use of a sling is not advised. It may cause significant stiffening of the shoulder joint.  Severe tendinitis may require:  Steroid injections into your shoulder joint.  Physical therapy.  Surgery. HOME CARE INSTRUCTIONS   Apply ice to your injury:  Put ice in a plastic bag.  Place a towel between your skin and the bag.  Leave the ice  on for 20 minutes, 2-3 times a day.  If you have a shoulder immobilizer (sling and straps), wear it until told otherwise by your health care provider.  You may want to sleep on several pillows or in a recliner at night to lessen swelling and pain.  Only take over-the-counter or prescription medicines for pain, discomfort, or fever as directed by your health care provider.  Do simple hand squeezing exercises with a soft rubber ball to decrease hand swelling. SEEK MEDICAL CARE IF:   Your shoulder pain increases, or new pain or numbness develops in your arm, hand, or fingers.  Your hand or fingers are colder than your other hand. SEEK IMMEDIATE MEDICAL CARE IF:   Your arm, hand, or fingers are numb or tingling.  Your arm, hand, or fingers are increasingly swollen and painful, or they turn white or blue. MAKE SURE YOU:  Understand these instructions.  Will watch your condition.  Will get help right away if you are not doing well or get worse.   This information is not intended to replace advice given to you by your health care provider. Make sure you discuss any questions you have with your health care provider.   Document Released: 07/29/2000 Document Revised: 08/06/2013 Document Reviewed: 03/13/2013 Elsevier Interactive Patient Education [employer]
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
## 2015-10-08 ENCOUNTER — Other Ambulatory Visit: Payer: Self-pay | Admitting: Orthopaedic Surgery

## 2015-10-08 DIAGNOSIS — M25512 Pain in left shoulder: Secondary | ICD-10-CM

## 2015-10-23 ENCOUNTER — Ambulatory Visit
Admission: RE | Admit: 2015-10-23 | Discharge: 2015-10-23 | Disposition: A | Discharge status: Home / Self Care | Payer: BLUE CROSS/BLUE SHIELD | Source: Ambulatory Visit | Attending: Orthopaedic Surgery | Admitting: Orthopaedic Surgery

## 2015-10-23 DIAGNOSIS — M25512 Pain in left shoulder: Secondary | ICD-10-CM

## 2015-10-23 MED ORDER — IOHEXOL 180 MG/ML  SOLN
10.0000 mL | Freq: Once | INTRAMUSCULAR | Status: AC | PRN
  Administered 2015-10-23: 10 mL via INTRA_ARTICULAR

## 2016-06-09 IMAGING — CT CT SHOULDER*L* W/CM
3 of 4 series · 7 of 14 positions shown, 8 images · non-contrast
Comparison: Images from contrast injection reviewed.

CLINICAL DATA: Chronic left shoulder pain and limited range of
motion which worsened over the past 6 months. No known injury.

EXAM:
CT ARTHROGRAPHY OF THE LEFT SHOULDER
TECHNIQUE: Multidetector CT imaging was performed following the standard
protocol after injection of dilute contrast into the joint.

[Series 2: soft tissue · axial · 0.46mm/px · z∈[-177,-114]mm · 2 of 63 slices shown]
[im 21/63  soft-tissue]
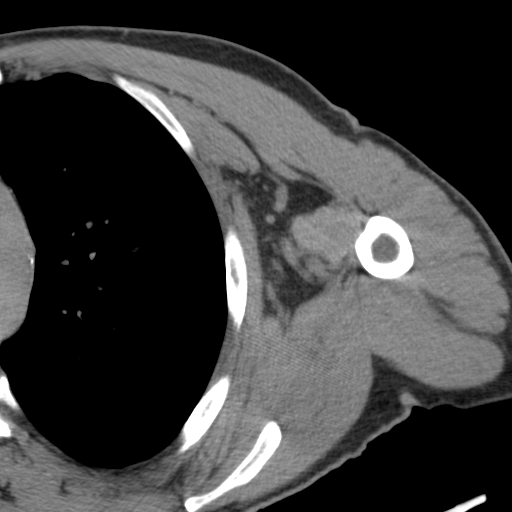
[im 42/63  soft-tissue]
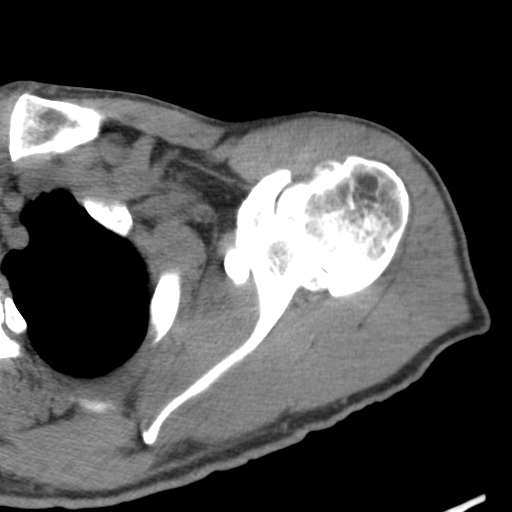

[Series 3: bone · axial · 0.46mm/px · z∈[-234,-51]mm · 3 of 62 slices shown, 4 images]
[im 1/62  soft-tissue]
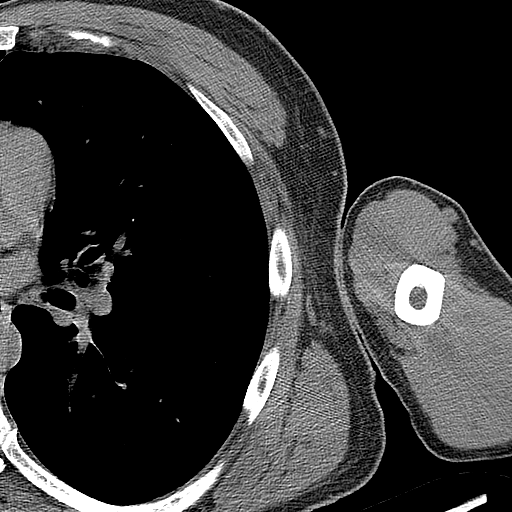
[im 1/62  bone]
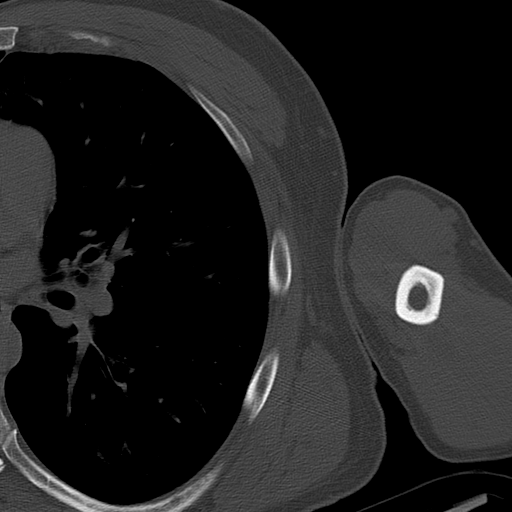
[im 31/62  bone]
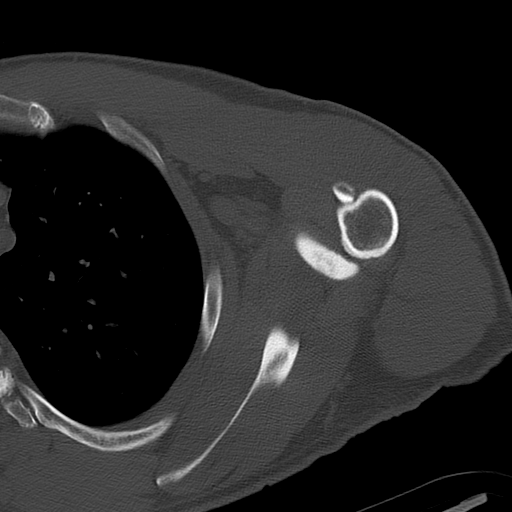
[im 62/62  bone]
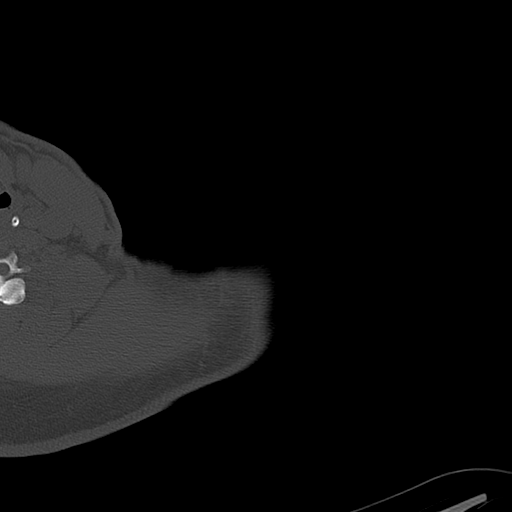

[Series 603: axial soft · axial · 0.46mm/px · z∈[-194,-146]mm · 2 of 77 slices shown]
[im 26/77  soft-tissue]
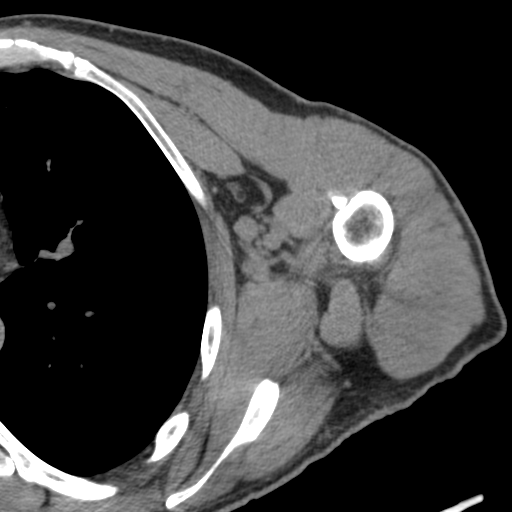
[im 51/77  soft-tissue]
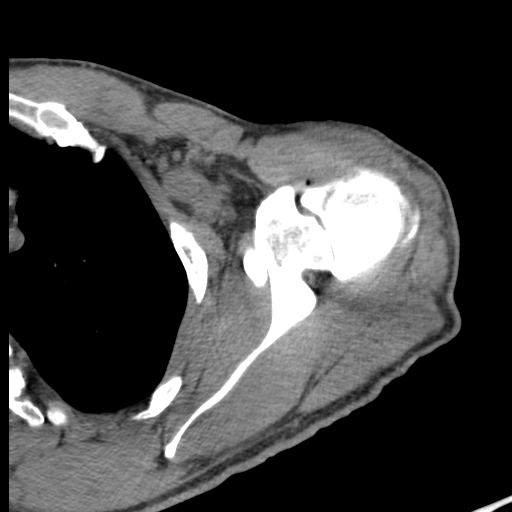

[7 of 14 positions shown; findings below may reference images not displayed]

FINDINGS: The joint is well distended with contrast. There is rotator cuff
tendinopathy. A focal fissure is seen in the infraspinatus tendon
but no full-thickness tear or retracted tendon is identified. The
long head of biceps and biceps anchor are intact. The posterior
labrum is degenerated but no labral tear is seen. Musculature about
the shoulder is normal without atrophy or focal lesion. Moderate
acromioclavicular degenerative change is seen. The acromion is type
2 with a small subacromial spur. The patient has remote healed left
clavicle fracture in anatomic position and alignment. Imaged lung
parenchyma is clear.
IMPRESSION: Rotator cuff tendinopathy with a small fissure in the infraspinatus
tendon but no full-thickness tear or retracted tendon.

Moderate acromioclavicular osteoarthritis. Type 2 acromion with a
small subacromial spur also noted.

Intact labrum.

Remote healed left clavicle fracture.

## 2017-09-14 DIAGNOSIS — IMO0002 Reserved for concepts with insufficient information to code with codable children: Secondary | ICD-10-CM | POA: Insufficient documentation

## 2017-12-22 ENCOUNTER — Ambulatory Visit (INDEPENDENT_AMBULATORY_CARE_PROVIDER_SITE_OTHER): Payer: 59

## 2017-12-22 ENCOUNTER — Encounter: Payer: Self-pay | Admitting: Podiatry

## 2017-12-22 ENCOUNTER — Telehealth: Payer: Self-pay | Admitting: *Deleted

## 2017-12-22 ENCOUNTER — Ambulatory Visit: Payer: 59 | Admitting: Podiatry

## 2017-12-22 ENCOUNTER — Telehealth: Payer: Self-pay | Admitting: Podiatry

## 2017-12-22 VITALS — BP 163/108 | HR 68

## 2017-12-22 DIAGNOSIS — T1490XA Injury, unspecified, initial encounter: Secondary | ICD-10-CM | POA: Diagnosis not present

## 2017-12-22 DIAGNOSIS — M2012 Hallux valgus (acquired), left foot: Secondary | ICD-10-CM

## 2017-12-22 DIAGNOSIS — S93602A Unspecified sprain of left foot, initial encounter: Secondary | ICD-10-CM

## 2017-12-22 DIAGNOSIS — M2042 Other hammer toe(s) (acquired), left foot: Secondary | ICD-10-CM

## 2017-12-22 MED ORDER — MELOXICAM 15 MG PO TABS: 15.0000 mg | ORAL_TABLET | Freq: Every day | ORAL | 0 refills | Status: DC

## 2017-12-22 NOTE — Telephone Encounter (Signed)
I was there this morning and they were supposed to call in a prescription to the [facility] on [address] in [place]. Apparently they have no record of it so I was calling to see if I needed to go somewhere else? I can be reached at [phone]. Thank you.

## 2017-12-22 NOTE — Telephone Encounter (Signed)
Called and spoke with patient to inform that the prescription Mobic(15 mg ) has been sent to pharmacy on file,he verbally understood.    By Shaliyah Taite, Malena Peer, RMA

## 2017-12-22 NOTE — Telephone Encounter (Signed)
I informed pt the meloxicam was called to the [facility] [id number] at 1:51pm.

## 2017-12-22 NOTE — Progress Notes (Signed)
This patient  presents to the office for an evaluation of his painful left foot.  He basically has 2 complaints.  He says he has a painful bunion in the big toe joint of the left foot.  He also has an overlapping second digit of the left foot.  This patient is concerned about the deformity of or an evaluation and treatment.  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  He states he sought no treatment and evaluation of his painful left foot.  He says by the end of the day this area on the outside top of his left foot is swollen and painful.  He has provided no self treatment nor sought any professional help for this foot injury.  This patient is also diabetic and taking medication.  He states he would like to have both these problems evaluated today  General Appearance  Alert, conversant and in no acute stress.  Vascular  Dorsalis pedis and posterior tibial  pulses are palpable  bilaterally.  Capillary return is within normal limits  bilaterally. Temperature is within normal limits  bilaterally.  Neurologic  Senn-Weinstein monofilament wire test within normal limits  bilaterally. Muscle power within normal limits bilaterally.  Nails normal nails noted with no evidence of bacterial or fungal infection.  Orthopedic  No limitations of motion of motion feet .  No crepitus or effusions noted.  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  Severe hammertoe deformity of the digits .  Patient has palpable pain noted  at the  metatarsal cuboid joint left foot..  Hypertropic bone is noted at this site  Skin  normotropic skin with no porokeratosis noted bilaterally.  No signs of infections or ulcers noted.    Foot Sprain left foot.    HAV Left  Hammer toes  Left.  IE.  X-rays taken reveal a severe HAV deformity first MPJ left foot.  Severe hammer toes noted on the left forefoot.  There  is no evidence of any displaced fracture at the base of the fifth metatarsal and cuboid left foot.  There is a flaking noted lateral to cuboid  on x-ray.  Patient states he being evaluated and treated for his forefoot deformity.  He says that his foot sprain, left foot is not significant.  I told this patient. We should consider an MRI and he suggested a CT scan.  He also never filed for a worker's comp and therefore he desires to return to work to file for [employer] prior to receiving further radiographic studies.  I told him that he should make a appointment with one of the surgeons forefoot correction.  They could also evaluate his foot sprain at that visit.  He was prescribed Mobic to be taken by mouth to help with his pain.  I watched this patient leave and he did walk with an antalgic gait.  It must be more painful than he described.   Helane Gunther DPM

## 2018-01-19 ENCOUNTER — Ambulatory Visit: Payer: 59 | Admitting: Podiatry

## 2018-01-19 ENCOUNTER — Encounter: Payer: Self-pay | Admitting: Podiatry

## 2018-01-19 DIAGNOSIS — M216X9 Other acquired deformities of unspecified foot: Secondary | ICD-10-CM

## 2018-01-19 DIAGNOSIS — M2012 Hallux valgus (acquired), left foot: Secondary | ICD-10-CM | POA: Diagnosis not present

## 2018-01-19 DIAGNOSIS — T1490XA Injury, unspecified, initial encounter: Secondary | ICD-10-CM | POA: Diagnosis not present

## 2018-01-19 DIAGNOSIS — M2042 Other hammer toe(s) (acquired), left foot: Secondary | ICD-10-CM | POA: Diagnosis not present

## 2018-01-19 NOTE — Patient Instructions (Signed)
Pre-Operative Instructions  Congratulations, you have decided to take an important step towards improving your quality of life.  You can be assured that the doctors and staff at [facility] will be with you every step of the way.  Here are some important things you should know:  1. Plan to be at the surgery center/hospital at least 1 (one) hour prior to your scheduled time, unless otherwise directed by the surgical center/hospital staff.  You must have a responsible adult accompany you, remain during the surgery and drive you home.  Make sure you have directions to the surgical center/hospital to ensure you arrive on time. 2. If you are having surgery at [facility] or [facility] hospitals, you will need a copy of your medical history and physical form from your family physician within one month prior to the date of surgery. We will give you a form for your primary physician to complete.  3. We make every effort to accommodate the date you request for surgery.  However, there are times where surgery dates or times have to be moved.  We will contact you as soon as possible if a change in schedule is required.   4. No aspirin/ibuprofen for one week before surgery.  If you are on aspirin, any non-steroidal anti-inflammatory medications (Mobic, Aleve, Ibuprofen) should not be taken seven (7) days prior to your surgery.  You make take Tylenol for pain prior to surgery.  5. Medications - If you are taking daily heart and blood pressure medications, seizure, reflux, allergy, asthma, anxiety, pain or diabetes medications, make sure you notify the surgery center/hospital before the day of surgery so they can tell you which medications you should take or avoid the day of surgery. 6. No food or drink after midnight the night before surgery unless directed otherwise by surgical center/hospital staff. 7. No alcoholic beverages 24-hours prior to surgery.  No smoking 24-hours prior or 24-hours after  surgery. 8. Wear loose pants or shorts. They should be loose enough to fit over bandages, boots, and casts. 9. Don't wear slip-on shoes. Sneakers are preferred. 10. Bring your boot with you to the surgery center/hospital.  Also bring crutches or a walker if your physician has prescribed it for you.  If you do not have this equipment, it will be provided for you after surgery. 11. If you have not been contacted by the surgery center/hospital by the day before your surgery, call to confirm the date and time of your surgery. 12. Leave-time from work may vary depending on the type of surgery you have.  Appropriate arrangements should be made prior to surgery with your employer. 13. Prescriptions will be provided immediately following surgery by your doctor.  Fill these as soon as possible after surgery and take the medication as directed. Pain medications will not be refilled on weekends and must be approved by the doctor. 14. Remove nail polish on the operative foot and avoid getting pedicures prior to surgery. 15. Wash the night before surgery.  The night before surgery wash the foot and leg well with water and the antibacterial soap provided. Be sure to pay special attention to beneath the toenails and in between the toes.  Wash for at least three (3) minutes. Rinse thoroughly with water and dry well with a towel.  Perform this wash unless told not to do so by your physician.  Enclosed: 1 Ice pack (please put in freezer the night before surgery)   1 Hibiclens skin cleaner     Pre-op instructions  If you have any questions regarding the instructions, please do not hesitate to call our office.  [place]: [address], [place], [place] [address] -- [phone]  [place]: [address] [place], [place] [address] -- [phone]  [place]: [address]  [place], [place] [address] -- [phone]  [place]: [address], [address], [place], [place] [address] -- [phone]  Website:  [email or web address] 

## 2018-01-21 NOTE — Progress Notes (Signed)
Subjective:   Patient ID: Chris [name], male   DOB: 52 y.o.   MRN: [id number]   HPI Patient presents after sustaining an injury last November with significant change in the left foot with significant digital contracture and the occurrence of bunion deformity over the 6 months.  Patient states he had no issues prior to the injury but he did jam his left foot completely and developed this progressive condition with progressive pain and inability to walk on his foot with any degree of comfort   ROS      Objective:  Physical Exam  Neurovascular status was found to be intact with patient found to have severe digital contracture digits 2 left with severe discomfort in the second metatarsal phalangeal joints of digits 2 and 3 with structural bunion that is developed due to the instability of the second toe.  Patient does have diabetes under excellent control and his sugar usually runs around 100.  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  Also was noted to have structural bunion deformity left which is developed secondary to the instability     Plan:  H&P and condition discussed at great length.  At this point due to the chronic nature of condition and the worsening of the digital deformities I do think digital stabilization procedures are necessary along with shortening osteotomies to open the joints up and possible as best as possible repair of the flexor plates.  Also structural bunion procedure will need to be done.  At this point I allowed patient to read consent form going over at great length alternative treatments and complications.  Patient is understanding of all this and wants surgery and after  extensive review signed consent form understanding there is [place] no guarantee as the position I can put these toes and whether or not they will stay in the proper position.  Patient was dispensed air fracture walker and understands total recovery will take 6 months to 1 year for these types of surgery.  He is scheduled and is encouraged to call with any questions he may have

## 2018-02-13 ENCOUNTER — Encounter: Payer: Self-pay | Admitting: Podiatry

## 2018-02-13 DIAGNOSIS — M2042 Other hammer toe(s) (acquired), left foot: Secondary | ICD-10-CM | POA: Diagnosis not present

## 2018-02-13 DIAGNOSIS — M21542 Acquired clubfoot, left foot: Secondary | ICD-10-CM | POA: Diagnosis not present

## 2018-02-13 DIAGNOSIS — M2012 Hallux valgus (acquired), left foot: Secondary | ICD-10-CM | POA: Diagnosis not present

## 2018-02-21 ENCOUNTER — Ambulatory Visit (INDEPENDENT_AMBULATORY_CARE_PROVIDER_SITE_OTHER): Payer: 59 | Admitting: Podiatry

## 2018-02-21 ENCOUNTER — Ambulatory Visit (INDEPENDENT_AMBULATORY_CARE_PROVIDER_SITE_OTHER): Payer: 59

## 2018-02-21 ENCOUNTER — Encounter: Payer: Self-pay | Admitting: Podiatry

## 2018-02-21 VITALS — BP 112/69 | HR 77 | Temp 97.1°F

## 2018-02-21 DIAGNOSIS — M2042 Other hammer toe(s) (acquired), left foot: Secondary | ICD-10-CM

## 2018-02-21 DIAGNOSIS — M2012 Hallux valgus (acquired), left foot: Secondary | ICD-10-CM

## 2018-02-21 MED ORDER — HYDROCODONE-ACETAMINOPHEN 5-325 MG PO TABS: 1.0000 | ORAL_TABLET | Freq: Four times a day (QID) | ORAL | 0 refills | Status: DC | PRN

## 2018-02-21 MED ORDER — PROMETHAZINE HCL 25 MG PO TABS: 25.0000 mg | ORAL_TABLET | Freq: Three times a day (TID) | ORAL | 0 refills | Status: DC | PRN

## 2018-02-22 NOTE — Progress Notes (Signed)
Subjective:   Patient ID: Chris [name], male   DOB: 52 y.o.   MRN: [id number]   HPI Patient states doing well with my left foot with pain in the beginning but much better now I am very happy with the position of everything   ROS      Objective:  Physical Exam  Neurovascular status intact negative Homans sign noted with pins in place second and third digits left with good alignment noted and significant reduction of severe preoperative deformity with wound edges found to be well coapted and stitches in place     Assessment:  Doing well post forefoot reconstruction of the left foot     Plan:  H&P condition reviewed sterile dressing reapplied continue immobilization elevation compression and will see back 2 weeks or earlier if any issues should occur  X-rays indicate that the osteotomies are healing well with fixation in good position in the digits in good alignment with fixation in place

## 2018-03-07 ENCOUNTER — Ambulatory Visit (INDEPENDENT_AMBULATORY_CARE_PROVIDER_SITE_OTHER): Payer: 59

## 2018-03-07 ENCOUNTER — Ambulatory Visit (INDEPENDENT_AMBULATORY_CARE_PROVIDER_SITE_OTHER): Payer: 59 | Admitting: Podiatry

## 2018-03-07 DIAGNOSIS — M2042 Other hammer toe(s) (acquired), left foot: Secondary | ICD-10-CM

## 2018-03-07 NOTE — Progress Notes (Signed)
Subjective:   Patient ID: Chris [name], male   DOB: 52 y.o.   MRN: [id number]   HPI Patient states overall feeling pretty good with mild discomfort if I do too much   ROS      Objective:  Physical Exam  Neurovascular status intact with patient's left foot healing well wound edges well coapted crusted type tissue but no active drainage noted and pins in place second and third toes     Assessment:  Doing well post forefoot reconstruction left     Plan:  X-rays reviewed with patient and at this time all stitches removed wound edges well coapted and continue with dressing immobilization elevation and reappoint pin removal 2 weeks or earlier if needed

## 2018-03-21 ENCOUNTER — Ambulatory Visit (INDEPENDENT_AMBULATORY_CARE_PROVIDER_SITE_OTHER): Payer: 59

## 2018-03-21 ENCOUNTER — Ambulatory Visit (INDEPENDENT_AMBULATORY_CARE_PROVIDER_SITE_OTHER): Payer: 59 | Admitting: Podiatry

## 2018-03-21 DIAGNOSIS — M216X9 Other acquired deformities of unspecified foot: Secondary | ICD-10-CM

## 2018-03-21 DIAGNOSIS — M2042 Other hammer toe(s) (acquired), left foot: Secondary | ICD-10-CM | POA: Diagnosis not present

## 2018-03-21 DIAGNOSIS — M2012 Hallux valgus (acquired), left foot: Secondary | ICD-10-CM

## 2018-03-22 NOTE — Progress Notes (Signed)
Subjective:   Patient ID: Chris LauthAnthony A Fagerstrom, male   DOB: 52 y.o.   MRN: [id number]   HPI Patient states overall doing well and occasionally gets some throbbing and sharp pain but overall and feeling much better and I am ready to get these pins at   ROS      Objective:  Physical Exam  Neurovascular status intact negative Homans sign noted with pins intact second and third digits wound edges all well coapted with good alignment of the forefoot left     Assessment:  Doing well post forefoot reconstruction left     Plan:  Advised patient on anti-inflammatories and pins removed second and third digit with sterile dressings applied.  Instructed on a BioSkin brace to lower the second and third toes with explanation on how to use it and this was above ankle to also provide compression and advised this patient on continued immobilization and range of motion exercises.  Reappoint to recheck 4 weeks or earlier if needed  X-ray indicates osteotomies healing well fixation in place with good alignment of the digits with slight elevation of the lesser digits

## 2018-04-20 ENCOUNTER — Ambulatory Visit (INDEPENDENT_AMBULATORY_CARE_PROVIDER_SITE_OTHER): Payer: 59 | Admitting: Podiatry

## 2018-04-20 ENCOUNTER — Ambulatory Visit (INDEPENDENT_AMBULATORY_CARE_PROVIDER_SITE_OTHER): Payer: 59

## 2018-04-20 ENCOUNTER — Encounter: Payer: Self-pay | Admitting: Podiatry

## 2018-04-20 DIAGNOSIS — M2042 Other hammer toe(s) (acquired), left foot: Secondary | ICD-10-CM

## 2018-04-22 NOTE — Progress Notes (Signed)
Subjective:   Patient ID: Chris [name], male   DOB: 52 y.o.   MRN: [id number]   HPI Patient presents stating overall is doing pretty well but his foot is still stiff and if he wears his brace it bothers him more.  Patient states that he is going back to work but he cannot be bearing weight full-time all day   ROS      Objective:  Physical Exam  Neurovascular status intact negative Homans sign noted with patient's left foot healing well overall wound edges well coapted toes in good alignment with moderate forefoot edema still noted consistent with 8 weeks post forefoot reconstruction     Assessment:  Doing well overall post forefoot reconstruction left     Plan:  H&P conditions reviewed and discussed.  At this point I recommended continued compression therapy elevation and gradual increase in activity level and shoe gear usage.  Patient still reduce physical activity and gradually build it up over the next 6 to 8 weeks  X-rays indicate that the osteotomies are healing well with good alignment noted fixation in place

## 2018-06-15 ENCOUNTER — Ambulatory Visit (INDEPENDENT_AMBULATORY_CARE_PROVIDER_SITE_OTHER): Payer: 59

## 2018-06-15 ENCOUNTER — Encounter: Payer: Self-pay | Admitting: Podiatry

## 2018-06-15 ENCOUNTER — Ambulatory Visit: Payer: 59 | Admitting: Podiatry

## 2018-06-15 DIAGNOSIS — M2042 Other hammer toe(s) (acquired), left foot: Secondary | ICD-10-CM | POA: Diagnosis not present

## 2018-06-15 NOTE — Progress Notes (Signed)
Subjective:   Patient ID: Chris [name], male   DOB: 52 y.o.   MRN: [id number]   HPI Patient states feeling quite a bit better with my left foot stating that I still get a little pain if I do too much   ROS      Objective:  Physical Exam  Neurovascular status intact with patient's left forefoot doing much better with excellent range of motion and no indications of pathology from a clinical standpoint     Assessment:  Has done very well post forefoot reconstruction left     Plan:  Advised ice this patient on continued range of motion exercises and patient rate may return to normal activity and will be seen back on an as-needed basis  X-ray indicates that the bone structure looks excellent with good healing with fixation in place

## 2018-10-15 ENCOUNTER — Telehealth: Payer: Self-pay | Admitting: Podiatry

## 2018-10-16 NOTE — Telephone Encounter (Signed)
This is [name] calling from [employer] in [place]. I'm following up on the letter we sent to Dr. Charlsie Merles in regards to Mr. Chris [name]. If you could call me back at [phone].

## 2018-10-16 NOTE — Telephone Encounter (Signed)
Called and left a message for [name] that I faxed the letter back with Dr. Beverlee Nims thoughts back on 27 February. Told him to call me back with any questions.

## 2018-11-15 ENCOUNTER — Telehealth: Payer: Self-pay | Admitting: Podiatry

## 2018-11-15 NOTE — Telephone Encounter (Signed)
I called pt and asked pt to describe what was going on with the surgery foot. Pt states he has throbbing pt in the area where the doctor broke the foot and put in the pin, after he has worked all day. I told pt it may not be related to the surgery but he would need to be evaluated and offered an appt. I told pt to wear his surgery shoe at home, as much as possible it may decrease the movement in the area and some of the discomfort he had. Pt states understanding and I transferred to schedulers.

## 2018-11-15 NOTE — Telephone Encounter (Signed)
Pt called stating that he is experiencing some throbbing pain in his surgery foot and would like to have a pain medication sent in for him. Please give patient a call.

## 2018-11-16 ENCOUNTER — Ambulatory Visit (INDEPENDENT_AMBULATORY_CARE_PROVIDER_SITE_OTHER): Payer: Self-pay

## 2018-11-16 ENCOUNTER — Encounter: Payer: Self-pay | Admitting: Podiatry

## 2018-11-16 ENCOUNTER — Other Ambulatory Visit: Payer: Self-pay

## 2018-11-16 ENCOUNTER — Ambulatory Visit (INDEPENDENT_AMBULATORY_CARE_PROVIDER_SITE_OTHER): Payer: 59 | Admitting: Podiatry

## 2018-11-16 VITALS — Temp 98.4°F

## 2018-11-16 DIAGNOSIS — M2012 Hallux valgus (acquired), left foot: Secondary | ICD-10-CM

## 2018-11-16 DIAGNOSIS — M7752 Other enthesopathy of left foot: Secondary | ICD-10-CM

## 2018-11-16 DIAGNOSIS — M779 Enthesopathy, unspecified: Secondary | ICD-10-CM

## 2018-11-16 MED ORDER — TRIAMCINOLONE ACETONIDE 10 MG/ML IJ SUSP
10.0000 mg | Freq: Once | INTRAMUSCULAR | Status: AC
  Administered 2018-11-16: 13:00:00 10 mg

## 2018-11-16 MED ORDER — HYDROCODONE-ACETAMINOPHEN 10-325 MG PO TABS: 1.0000 | ORAL_TABLET | Freq: Three times a day (TID) | ORAL | 0 refills | Status: DC | PRN

## 2018-11-19 NOTE — Progress Notes (Signed)
Subjective:   Patient ID: Chris [name], male   DOB: 53 y.o.   MRN: [id number]   HPI Patient has returned to work but was concerned about some sharp pains that he gets occasionally around the big toe joint left and whether there may be some kind of inflammation.  He is very happy with the results of the surgery and does not have the pain he had preoperatively but he is getting some inflammation   ROS      Objective:  Physical Exam  Neurovascular status intact muscle strength is adequate with patient found to have inflammation discomfort around the lateral side of the first MPJ left.  It is localized and I did not note any joint restriction or crepitus     Assessment:  Probability for inflammatory capsulitis left with good structure and position of the toes and first metatarsal     Plan:  H&P condition reviewed at today I did a sterile prep of the lateral side of the joint and I injected the lateral capsule 3 mg Kenalog 5 mg Xylocaine and advised on reduced activity rigid bottom shoes.  Reappoint to recheck if symptoms persist  X-rays indicate that the overall alignment looks good joint congruence with no signs of pathology

## 2019-07-01 ENCOUNTER — Other Ambulatory Visit: Payer: Self-pay

## 2019-07-01 ENCOUNTER — Encounter (HOSPITAL_COMMUNITY): Payer: Self-pay | Admitting: Clinical

## 2019-07-01 ENCOUNTER — Observation Stay (HOSPITAL_COMMUNITY)
Admission: RE | Admit: 2019-07-01 | Discharge: 2019-07-02 | Disposition: A | Discharge status: Home / Self Care | Payer: BC Managed Care – PPO | Attending: Psychiatry | Admitting: Psychiatry

## 2019-07-01 DIAGNOSIS — G4730 Sleep apnea, unspecified: Secondary | ICD-10-CM | POA: Diagnosis not present

## 2019-07-01 DIAGNOSIS — F329 Major depressive disorder, single episode, unspecified: Secondary | ICD-10-CM | POA: Diagnosis not present

## 2019-07-01 DIAGNOSIS — Z79899 Other long term (current) drug therapy: Secondary | ICD-10-CM | POA: Diagnosis not present

## 2019-07-01 DIAGNOSIS — F17210 Nicotine dependence, cigarettes, uncomplicated: Secondary | ICD-10-CM | POA: Insufficient documentation

## 2019-07-01 DIAGNOSIS — E119 Type 2 diabetes mellitus without complications: Secondary | ICD-10-CM | POA: Diagnosis not present

## 2019-07-01 DIAGNOSIS — Z7984 Long term (current) use of oral hypoglycemic drugs: Secondary | ICD-10-CM | POA: Insufficient documentation

## 2019-07-01 DIAGNOSIS — Z20828 Contact with and (suspected) exposure to other viral communicable diseases: Secondary | ICD-10-CM | POA: Insufficient documentation

## 2019-07-01 DIAGNOSIS — F1494 Cocaine use, unspecified with cocaine-induced mood disorder: Secondary | ICD-10-CM | POA: Diagnosis not present

## 2019-07-01 DIAGNOSIS — F1094 Alcohol use, unspecified with alcohol-induced mood disorder: Secondary | ICD-10-CM | POA: Diagnosis not present

## 2019-07-01 DIAGNOSIS — F1994 Other psychoactive substance use, unspecified with psychoactive substance-induced mood disorder: Secondary | ICD-10-CM | POA: Diagnosis present

## 2019-07-01 LAB — SARS CORONAVIRUS 2 BY RT PCR (HOSPITAL ORDER, PERFORMED IN ~~LOC~~ HOSPITAL LAB): SARS Coronavirus 2: NEGATIVE

## 2019-07-01 LAB — GLUCOSE, CAPILLARY: Glucose-Capillary: 115 mg/dL — ABNORMAL HIGH (ref 70–99)

## 2019-07-01 MED ORDER — METFORMIN HCL ER 500 MG PO TB24
500.0000 mg | ORAL_TABLET | Freq: Every day | ORAL | Status: DC
  Administered 2019-07-02: 500 mg via ORAL
  Filled 2019-07-01 (×3): qty 1

## 2019-07-01 MED ORDER — ACETAMINOPHEN 325 MG PO TABS: 650.0000 mg | ORAL_TABLET | Freq: Four times a day (QID) | ORAL | Status: DC | PRN

## 2019-07-01 MED ORDER — CANAGLIFLOZIN 100 MG PO TABS
100.0000 mg | ORAL_TABLET | Freq: Every day | ORAL | Status: DC
  Administered 2019-07-02: 100 mg via ORAL
  Filled 2019-07-01 (×3): qty 1
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
## 2019-07-01 NOTE — Plan of Care (Signed)
[facility] Observation Crisis Plan  Reason for Crisis Plan:  Crisis Stabilization and Substance Abuse   Plan of Care:  Referral for IOP, referral for Substance Abuse  Family Support:    wife and uncle  Current Living Environment:  Living Arrangements: Spouse/significant other  Insurance:   Hospital Account    Name Acct ID Class Status Primary Coverage   Chris [name], Chris [name] [id number] [place] - BCBS COMM PPO        Guarantor Account (for Hospital Account [id number])    Name Relation to Pt Service Area Active? Acct Type   Chris [name] Self [facility] Yes [facility]   Address Phone       [address] [place], [place] [address] [phone]) [phone])          Coverage Information (for Hospital Account [id number])    F/O Payor/Plan Precert #   BLUE CROSS BLUE SHIELD/BCBS COMM PPO    Subscriber Subscriber #   Chris [name] [id number]   Address Phone   [address] [place], [place] [address] [phone]      Legal Guardian:  Legal Guardian: (self)  Primary Care Provider:  Jilda Panda, MD  Current Outpatient Providers:  N/A  Psychiatrist:  Name of Psychiatrist: none  Counselor/Therapist:  Name of Therapist: none  Compliant with Medications:  Yes  Additional Information: "I do have a PCP doctor who order my medicines for my diabetes".   Chris [name] 11/16/20201:47 PM
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
## 2019-07-01 NOTE — Progress Notes (Signed)
Patient ID: Chris [name], male   DOB: Dec 01, 1965, 53 y.o.   MRN: [id number] Pt A&O x 4, resting at present, no distress noted, calm & cooperative at present.  Passive SI noted.  Monitoring for safety.

## 2019-07-02 ENCOUNTER — Encounter (HOSPITAL_COMMUNITY): Payer: Self-pay | Admitting: Registered Nurse

## 2019-07-02 DIAGNOSIS — F1994 Other psychoactive substance use, unspecified with psychoactive substance-induced mood disorder: Secondary | ICD-10-CM | POA: Diagnosis not present

## 2019-07-02 LAB — COMPREHENSIVE METABOLIC PANEL
ALT: 40 U/L (ref 0–44)
AST: 25 U/L (ref 15–41)
Albumin: 3.7 g/dL (ref 3.5–5.0)
Alkaline Phosphatase: 56 U/L (ref 38–126)
Anion gap: 10 (ref 5–15)
BUN: 14 mg/dL (ref 6–20)
CO2: 27 mmol/L (ref 22–32)
Calcium: 9 mg/dL (ref 8.9–10.3)
Chloride: 101 mmol/L (ref 98–111)
Creatinine, Ser: 0.99 mg/dL (ref 0.61–1.24)
GFR calc Af Amer: >60 mL/min (ref >60)
GFR calc non Af Amer: >60 mL/min (ref >60)
Glucose, Bld: 175 mg/dL — ABNORMAL HIGH (ref 70–99)
Potassium: 3.7 mmol/L (ref 3.5–5.1)
Sodium: 138 mmol/L (ref 135–145)
Total Bilirubin: 0.7 mg/dL (ref 0.3–1.2)
Total Protein: 6.7 g/dL (ref 6.5–8.1)

## 2019-07-02 LAB — LIPID PANEL
Cholesterol: 209 mg/dL — ABNORMAL HIGH (ref 0–200)
HDL: 37 mg/dL — ABNORMAL LOW (ref >40)
LDL Cholesterol: 138 mg/dL — ABNORMAL HIGH (ref 0–99)
Total CHOL/HDL Ratio: 5.6 RATIO
Triglycerides: 171 mg/dL — ABNORMAL HIGH (ref <150)
VLDL: 34 mg/dL (ref 0–40)

## 2019-07-02 LAB — CBC
HCT: 53.5 % — ABNORMAL HIGH (ref 39.0–52.0)
Hemoglobin: 17.2 g/dL — ABNORMAL HIGH (ref 13.0–17.0)
MCH: 31.7 pg (ref 26.0–34.0)
MCHC: 32.1 g/dL (ref 30.0–36.0)
MCV: 98.7 fL (ref 80.0–100.0)
Platelets: 249 10*3/uL (ref 150–400)
RBC: 5.42 MIL/uL (ref 4.22–5.81)
RDW: 13.6 % (ref 11.5–15.5)
WBC: 9.3 10*3/uL (ref 4.0–10.5)
nRBC: 0 % (ref 0.0–0.2)

## 2019-07-02 LAB — TSH: TSH: 1.32 u[IU]/mL (ref 0.350–4.500)

## 2019-07-02 LAB — ETHANOL: Alcohol, Ethyl (B): <10 mg/dL (ref <10)

## 2019-07-02 LAB — HEMOGLOBIN A1C
Hgb A1c MFr Bld: 7.7 % — ABNORMAL HIGH (ref 4.8–5.6)
Mean Plasma Glucose: 174.29 mg/dL

## 2019-07-02 LAB — GLUCOSE, CAPILLARY: Glucose-Capillary: 147 mg/dL — ABNORMAL HIGH (ref 70–99)

## 2019-07-02 MED ORDER — METFORMIN HCL ER 500 MG PO TB24: 500.0000 mg | ORAL_TABLET | Freq: Every day | ORAL | 0 refills | Status: AC

## 2019-07-02 MED ORDER — CANAGLIFLOZIN 100 MG PO TABS: 100.0000 mg | ORAL_TABLET | Freq: Every day | ORAL | 30 refills | Status: AC

## 2019-07-02 NOTE — Progress Notes (Signed)
D: Pt A & O X 4. Denies SI, HI, AVH and pain at this time. Pleasant on interactions "I slept well last night with my C-PAP, I feel much better after getting some rest". Pt D/C home as ordered. Drove his car home.  A: D/C instructions reviewed with pt including prescriptions and follow up care; compliance encouraged. All belongings from locker 40 was returned to pt at time of departure. Scheduled medications given with verbal education and effects monitored. Safety checks maintained without incident till time of d/c.  R: Pt receptive to care. Compliant with medications when offered. Denies adverse drug reactions when assessed. Verbalized understanding related to d/c instructions. Signed belonging sheet in agreement with items received from locker. Ambulatory with a steady gait. Appears to be in no physical distress at time of departure.

## 2019-07-02 NOTE — Discharge Instructions (Signed)
To help you maintain a sober lifestyle, a substance abuse treatment program may be beneficial to you.  Contact [facility] at your earliest opportunity to ask about enrolling in their program:       [facility]      [address]      [place], [place] [address]      [phone]
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
## 2019-07-02 NOTE — BH Assessment (Signed)
[place] Assessment Progress Note  Per Shuvon Rankin, FNP, this pt does not require psychiatric hospitalization at this time.  Pt is to be discharged from the [facility] Observation Unit with outpatient referrals.  This Probation officer spoke to pt in person.  He reports that he is interested in information regarding [employer].  This has been included in pt's discharge instructions.  Pt's nurse, Nicoletta Dress, has been notified.  Jalene Mullet, [place] Triage Specialist [phone]

## 2020-08-04 ENCOUNTER — Other Ambulatory Visit: Payer: Self-pay | Admitting: Internal Medicine

## 2020-08-04 DIAGNOSIS — F17210 Nicotine dependence, cigarettes, uncomplicated: Secondary | ICD-10-CM

## 2021-06-30 ENCOUNTER — Other Ambulatory Visit: Payer: Self-pay | Admitting: Surgery
